# Patient Record
Sex: Female | Born: 1955 | Race: White | Hispanic: No | Marital: Married
Health system: Southern US, Community
[De-identification: ages and names within clinical notes are randomized; demographics above are authoritative.]

---

## 1999-09-16 ENCOUNTER — Encounter: Admission: RE | Admit: 1999-09-16 | Discharge: 1999-09-16 | Payer: Self-pay | Admitting: *Deleted

## 1999-09-16 ENCOUNTER — Encounter: Payer: Self-pay | Admitting: *Deleted

## 1999-09-29 ENCOUNTER — Other Ambulatory Visit: Admission: RE | Admit: 1999-09-29 | Discharge: 1999-09-29 | Payer: Self-pay | Admitting: *Deleted

## 1999-10-01 ENCOUNTER — Encounter: Payer: Self-pay | Admitting: *Deleted

## 1999-10-01 ENCOUNTER — Encounter: Admission: RE | Admit: 1999-10-01 | Discharge: 1999-10-01 | Payer: Self-pay | Admitting: Unknown Physician Specialty

## 2000-11-09 ENCOUNTER — Encounter: Payer: Self-pay | Admitting: *Deleted

## 2000-11-09 ENCOUNTER — Encounter: Admission: RE | Admit: 2000-11-09 | Discharge: 2000-11-09 | Payer: Self-pay | Admitting: *Deleted

## 2000-12-06 ENCOUNTER — Other Ambulatory Visit: Admission: RE | Admit: 2000-12-06 | Discharge: 2000-12-06 | Payer: Self-pay | Admitting: *Deleted

## 2000-12-11 ENCOUNTER — Encounter: Admission: RE | Admit: 2000-12-11 | Discharge: 2000-12-11 | Payer: Self-pay | Admitting: *Deleted

## 2000-12-11 ENCOUNTER — Encounter: Payer: Self-pay | Admitting: *Deleted

## 2001-05-08 ENCOUNTER — Encounter: Payer: Self-pay | Admitting: *Deleted

## 2001-05-08 ENCOUNTER — Encounter: Admission: RE | Admit: 2001-05-08 | Discharge: 2001-05-08 | Payer: Self-pay | Admitting: *Deleted

## 2001-05-29 ENCOUNTER — Encounter: Admission: RE | Admit: 2001-05-29 | Discharge: 2001-05-29 | Payer: Self-pay | Admitting: Plastic Surgery

## 2001-05-29 ENCOUNTER — Encounter: Payer: Self-pay | Admitting: Plastic Surgery

## 2001-07-30 ENCOUNTER — Encounter: Payer: Self-pay | Admitting: *Deleted

## 2001-07-30 ENCOUNTER — Encounter: Admission: RE | Admit: 2001-07-30 | Discharge: 2001-07-30 | Payer: Self-pay | Admitting: Family Medicine

## 2001-08-10 ENCOUNTER — Encounter: Payer: Self-pay | Admitting: *Deleted

## 2001-08-10 ENCOUNTER — Other Ambulatory Visit: Admission: RE | Admit: 2001-08-10 | Discharge: 2001-08-10 | Payer: Self-pay | Admitting: Radiology

## 2001-08-10 ENCOUNTER — Encounter: Admission: RE | Admit: 2001-08-10 | Discharge: 2001-08-10 | Payer: Self-pay | Admitting: *Deleted

## 2006-11-16 ENCOUNTER — Ambulatory Visit (HOSPITAL_BASED_OUTPATIENT_CLINIC_OR_DEPARTMENT_OTHER): Admission: RE | Admit: 2006-11-16 | Discharge: 2006-11-16 | Payer: Self-pay | Admitting: Orthopedic Surgery

## 2008-09-18 ENCOUNTER — Ambulatory Visit: Payer: Self-pay | Admitting: Internal Medicine

## 2008-09-30 ENCOUNTER — Ambulatory Visit: Payer: Self-pay | Admitting: Internal Medicine

## 2009-01-05 ENCOUNTER — Ambulatory Visit: Payer: Self-pay | Admitting: Otolaryngology

## 2009-03-05 ENCOUNTER — Ambulatory Visit: Payer: Self-pay | Admitting: Otolaryngology

## 2009-12-21 ENCOUNTER — Ambulatory Visit: Payer: Self-pay | Admitting: Internal Medicine

## 2012-09-24 ENCOUNTER — Ambulatory Visit: Payer: Self-pay

## 2014-01-14 IMAGING — CR LEFT WRIST - COMPLETE 3+ VIEW
1 series · 4 of 4 positions shown · non-contrast
Comparison: none

REASON FOR EXAM: wrist pain
COMMENTS:

[Series 1: x wrist pa left · 0.14mm/px · 4 of 4 slices shown]
[im 1/4]
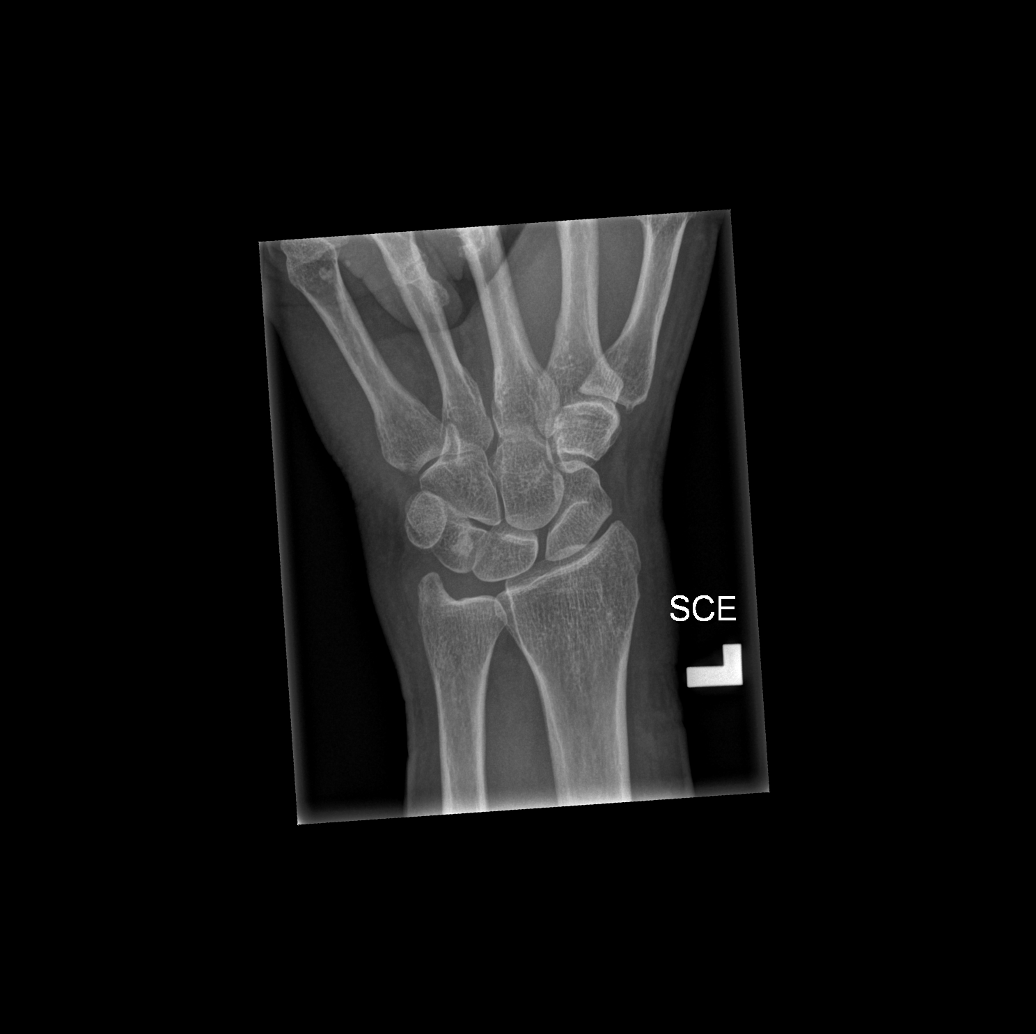
[im 2/4]
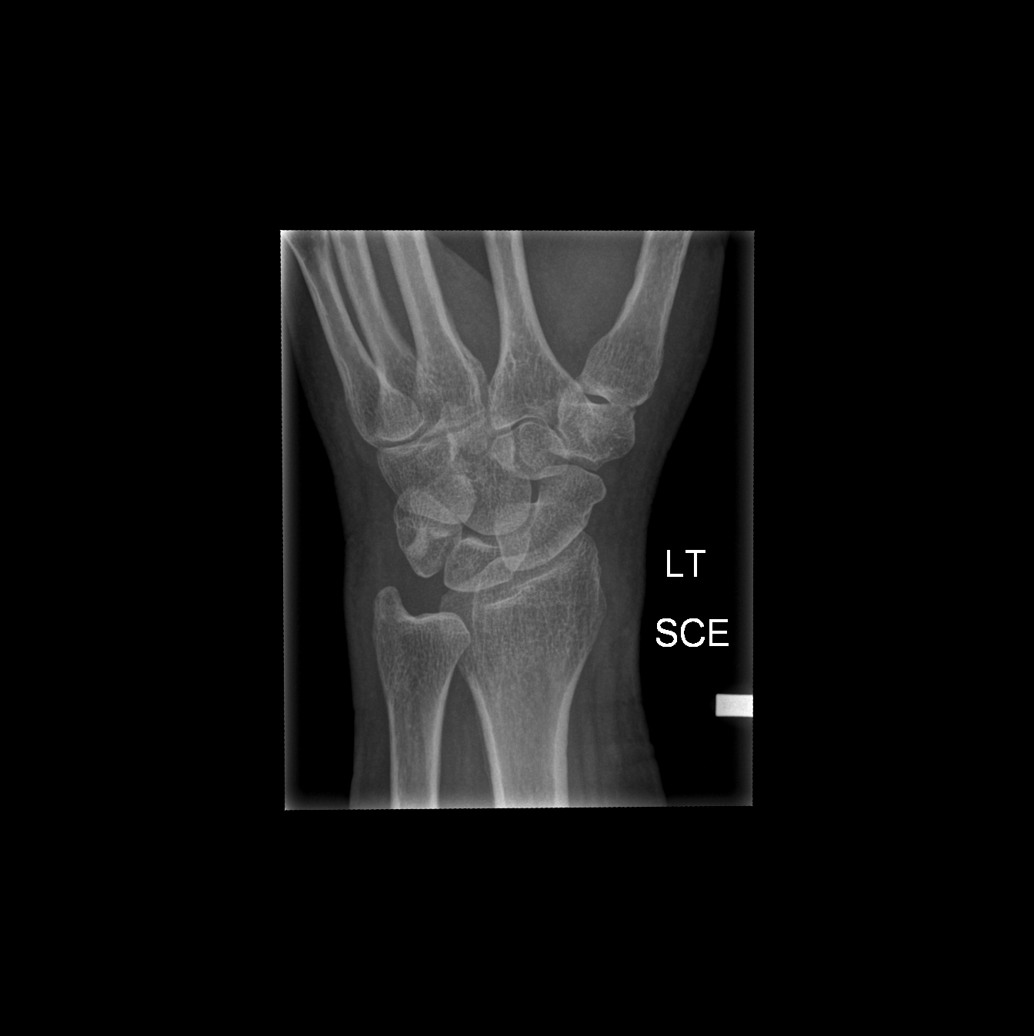
[im 3/4]
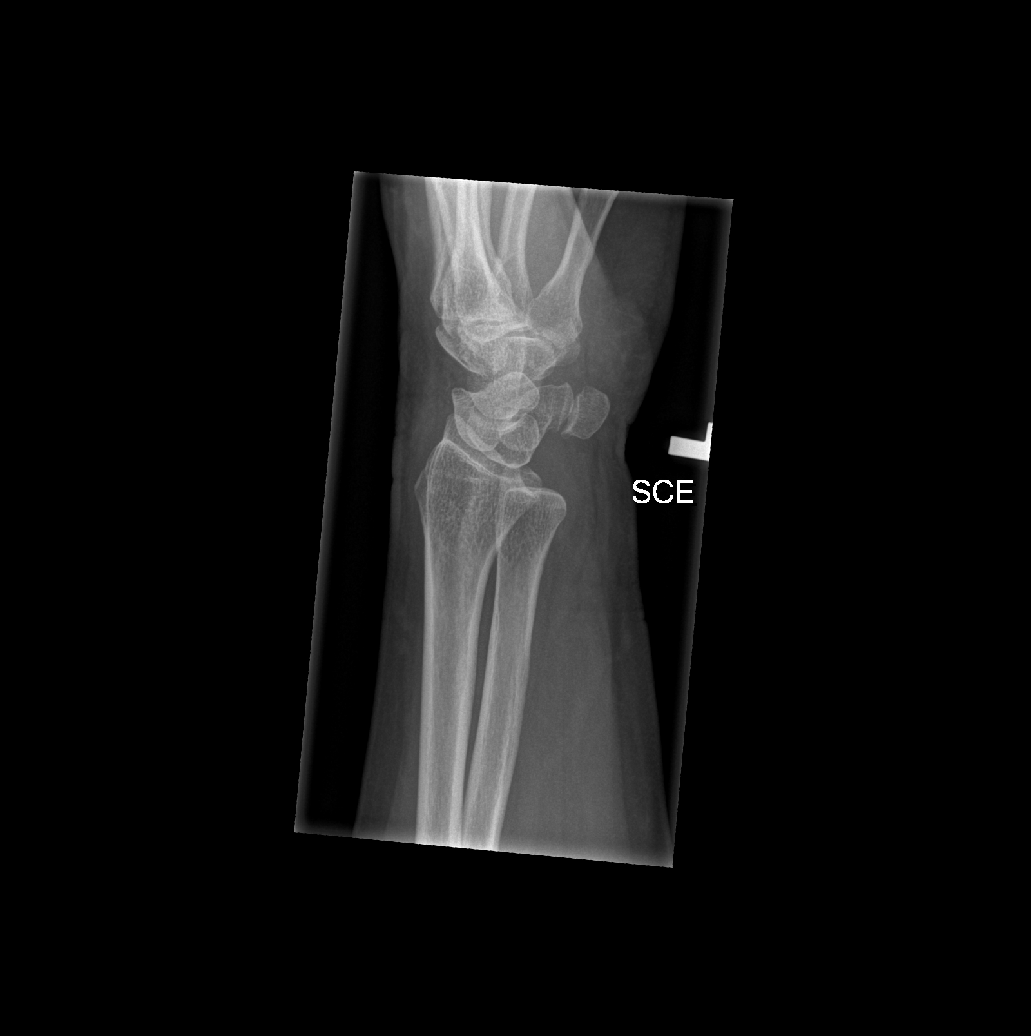
[im 4/4]
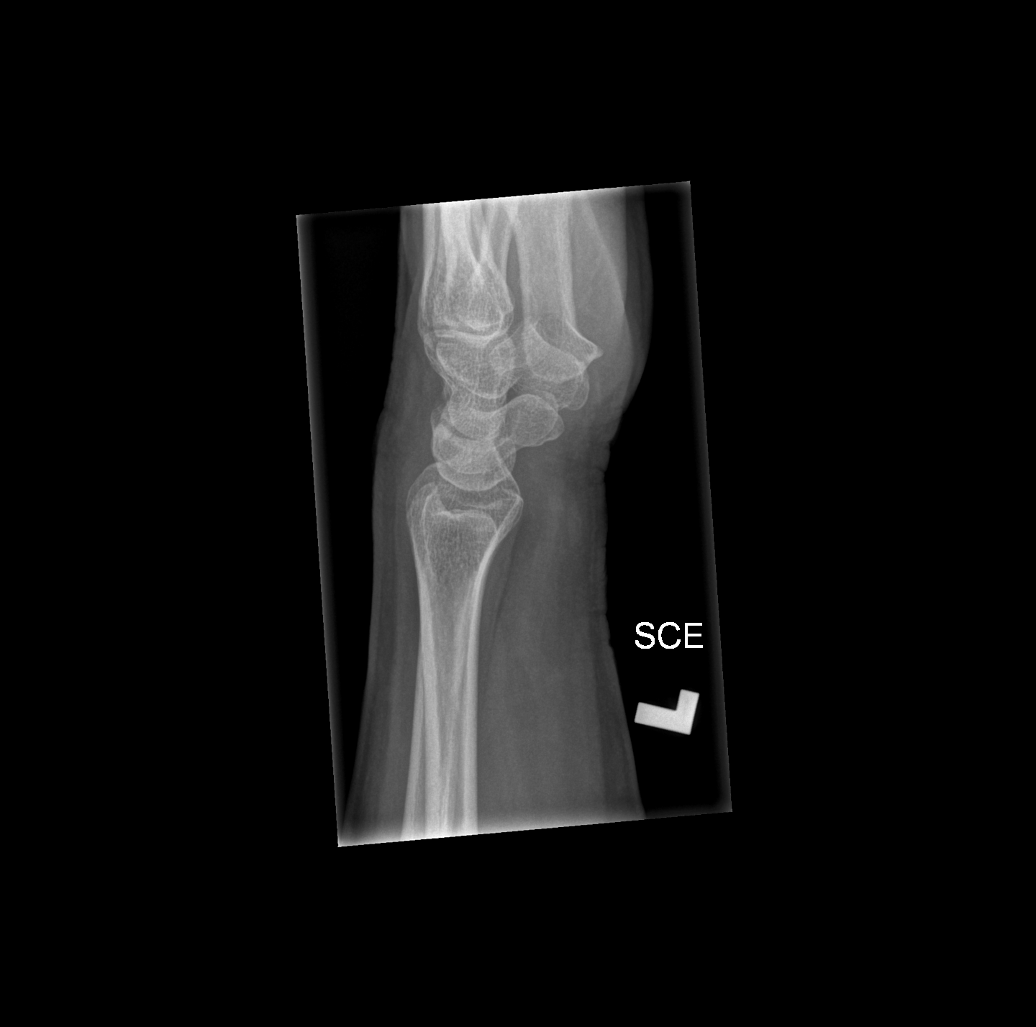

[4 of 4 positions shown; findings below may reference images not displayed]

PROCEDURE:     DXR - DXR WRIST LT COMP WITH OBLIQUES  - September 24, 2012 [DATE]

RESULT:     Four views of the left wrist are submitted. The bones are
reasonably well mineralized for age. There is no evidence of an acute or
healing fracture. No significant degenerative changes are demonstrated. The
overlying soft tissues are normal in appearance.
IMPRESSION: There is no acute bony abnormality of the left wrist. If
there are strong clinical concerns of an occult fracture, further evaluation
with CT scanning is available upon reque[REDACTED]

## 2017-11-14 ENCOUNTER — Other Ambulatory Visit: Payer: Self-pay | Admitting: Nurse Practitioner

## 2017-12-13 ENCOUNTER — Other Ambulatory Visit: Payer: Self-pay

## 2017-12-15 ENCOUNTER — Other Ambulatory Visit: Payer: Self-pay | Admitting: Internal Medicine

## 2017-12-26 ENCOUNTER — Other Ambulatory Visit: Payer: Self-pay

## 2017-12-26 MED ORDER — MONTELUKAST SODIUM 10 MG PO TABS: 10.0000 mg | ORAL_TABLET | Freq: Every day | ORAL | 0 refills | Status: AC

## 2020-09-04 ENCOUNTER — Telehealth: Payer: Self-pay

## 2020-09-04 NOTE — Telephone Encounter (Signed)
Completed medical record request and mailed requested records to [facility] at [address] [place], [place] [address].
